# Patient Record
Sex: Male | Born: 1995 | Race: Black or African American | Hispanic: No | Marital: Single | Smoking: Current every day smoker
Health system: Southern US, Community
[De-identification: ages and names within clinical notes are randomized; demographics above are authoritative.]

## PROBLEM LIST (undated history)

## (undated) HISTORY — PX: SKIN GRAFT: SHX250

## (undated) HISTORY — PX: TOE AMPUTATION: SHX809

---
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
## 2014-04-30 ENCOUNTER — Emergency Department (INDEPENDENT_AMBULATORY_CARE_PROVIDER_SITE_OTHER): Payer: BC Managed Care – PPO

## 2014-04-30 ENCOUNTER — Encounter (HOSPITAL_COMMUNITY): Payer: Self-pay | Admitting: Emergency Medicine

## 2014-04-30 ENCOUNTER — Emergency Department (INDEPENDENT_AMBULATORY_CARE_PROVIDER_SITE_OTHER)
Admission: EM | Admit: 2014-04-30 | Discharge: 2014-04-30 | Disposition: A | Discharge status: Home / Self Care | Payer: BC Managed Care – PPO | Source: Home / Self Care | Attending: Family Medicine | Admitting: Family Medicine

## 2014-04-30 DIAGNOSIS — M21962 Unspecified acquired deformity of left lower leg: Secondary | ICD-10-CM

## 2014-04-30 DIAGNOSIS — M79672 Pain in left foot: Secondary | ICD-10-CM

## 2014-04-30 MED ORDER — DICLOFENAC SODIUM 75 MG PO TBEC: 75.0000 mg | DELAYED_RELEASE_TABLET | Freq: Two times a day (BID) | ORAL | Status: AC | PRN
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
## 2014-04-30 NOTE — Discharge Instructions (Signed)
Thank you for coming in today. Follow up with Podiatry.  Take voltaren for pain as needed.  Go to the emergency room if you have acutely worsening pain

## 2014-04-30 NOTE — ED Notes (Signed)
Patient c/o left foot pain x 1 week. Patient reports he has had multiple surgeries and has had toes amputated due to injury. Patient reports foot hurts when he stands or puts pressure on it. Patient ambulated to room well. NAD.

## 2015-03-13 IMAGING — CR DG FOOT COMPLETE 3+V*L*
3 series · 3 of 3 positions shown · non-contrast
Comparison: None.

CLINICAL DATA: Left foot pain.  No recent trauma.

EXAM:
LEFT FOOT - COMPLETE 3+ VIEW

[foot ap]
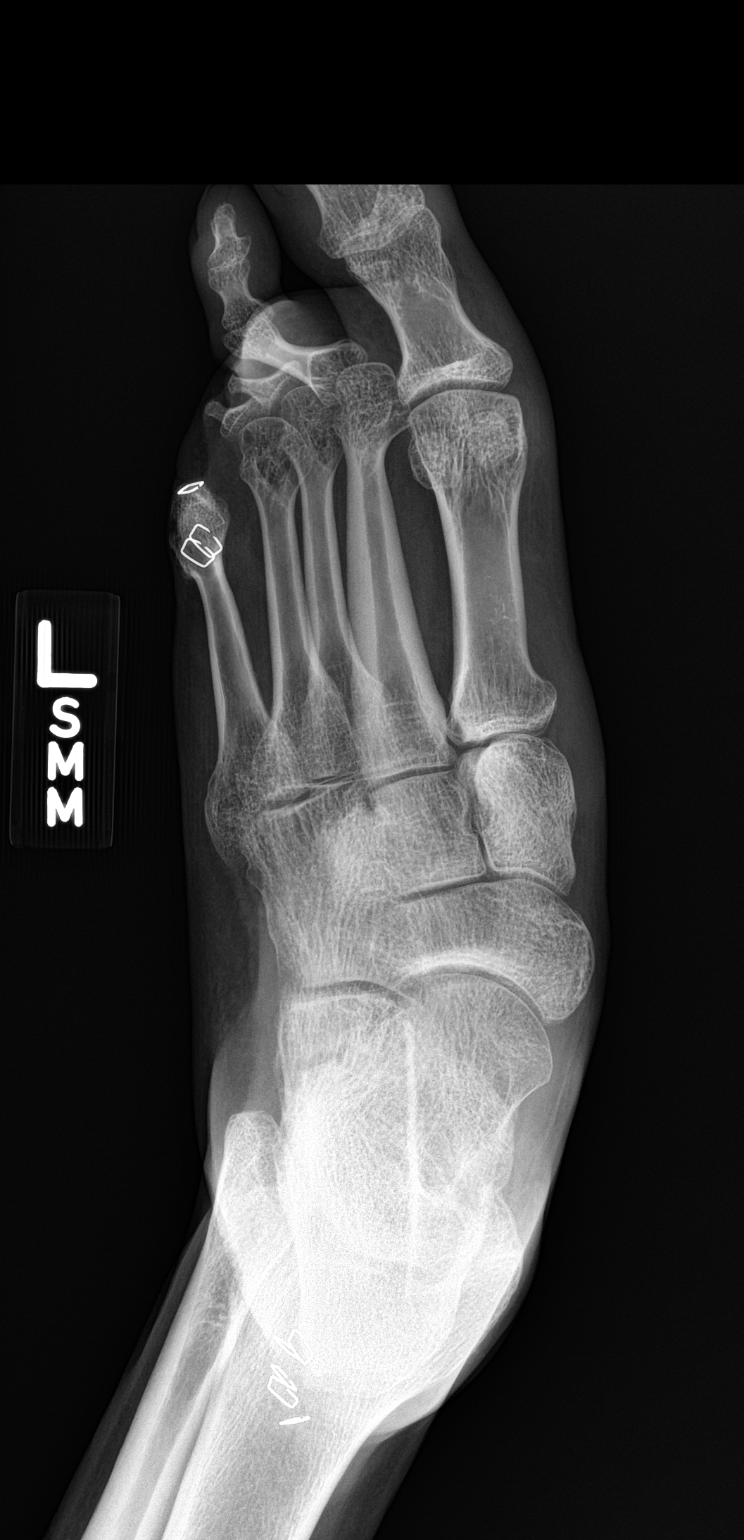

[foot obl]
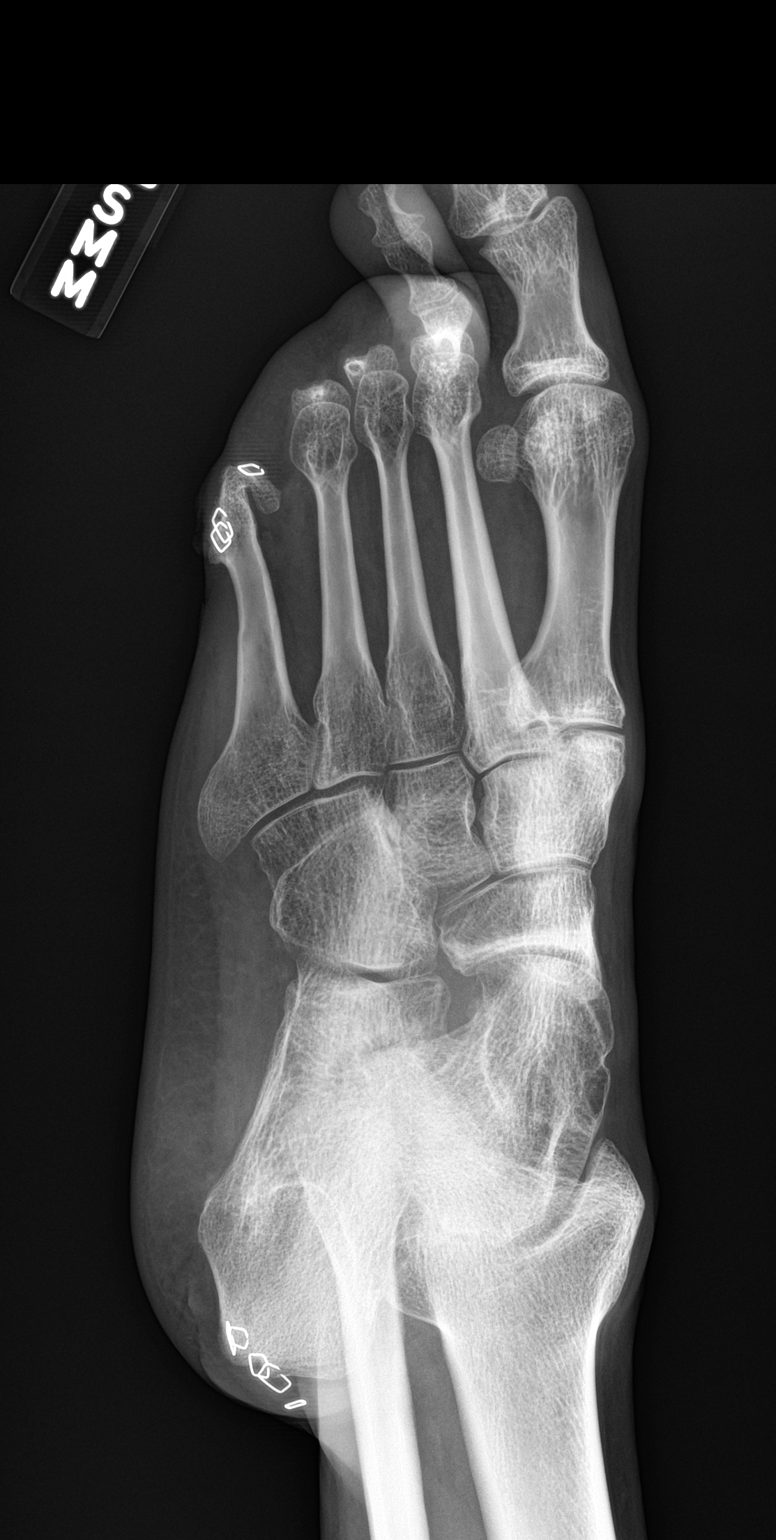

[foot lat]
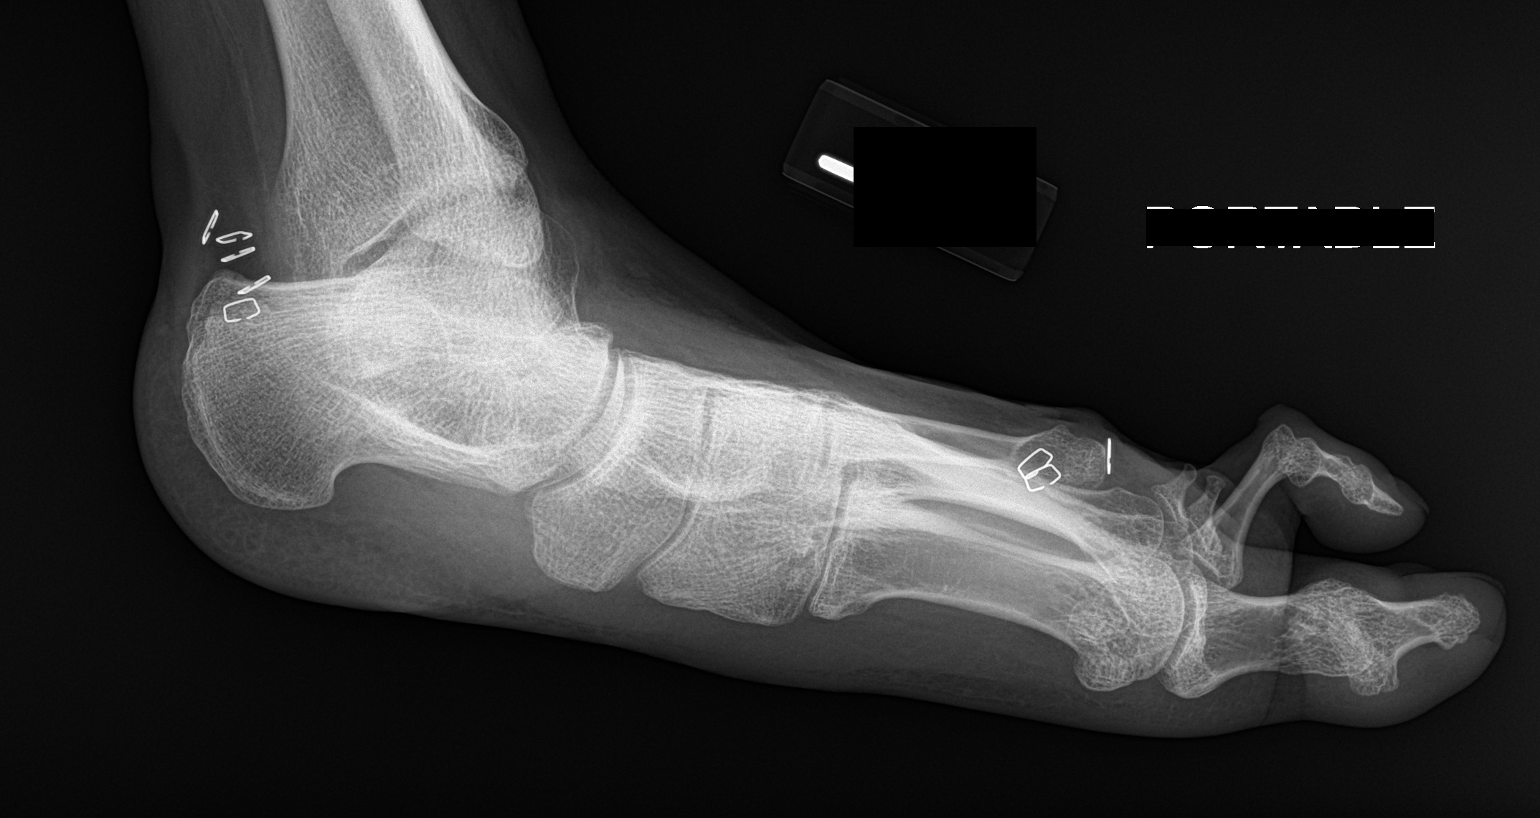

[3 of 3 positions shown; findings below may reference images not displayed]

FINDINGS: The little toe and the head of the fifth metatarsal appear to have
been resected with some chronic deformity of the distal fifth
metatarsal. The third and fourth toes appear to have been amputated
except for the remnants of the bases of the proximal phalanges.

There are minimal degenerative changes at the first tarsal
metatarsal joint. The patient does have a flatfoot deformity.
Surgical staples is seen adjacent to the head of the fifth
metatarsal and posterior to the calcaneus.
IMPRESSION: No acute abnormalities. Chronic deformities and probable
postsurgical changes as described.

## 2021-01-27 ENCOUNTER — Other Ambulatory Visit: Payer: Self-pay

## 2021-01-27 ENCOUNTER — Emergency Department (HOSPITAL_BASED_OUTPATIENT_CLINIC_OR_DEPARTMENT_OTHER)
Admission: EM | Admit: 2021-01-27 | Discharge: 2021-01-28 | Disposition: A | Discharge status: Home / Self Care | Payer: Self-pay | Attending: Emergency Medicine | Admitting: Emergency Medicine

## 2021-01-27 DIAGNOSIS — Z23 Encounter for immunization: Secondary | ICD-10-CM | POA: Insufficient documentation

## 2021-01-27 DIAGNOSIS — F17200 Nicotine dependence, unspecified, uncomplicated: Secondary | ICD-10-CM | POA: Insufficient documentation

## 2021-01-27 DIAGNOSIS — W260XXA Contact with knife, initial encounter: Secondary | ICD-10-CM | POA: Insufficient documentation

## 2021-01-27 DIAGNOSIS — S61512A Laceration without foreign body of left wrist, initial encounter: Secondary | ICD-10-CM | POA: Insufficient documentation

## 2021-01-27 DIAGNOSIS — S61412A Laceration without foreign body of left hand, initial encounter: Secondary | ICD-10-CM

## 2021-01-27 MED ORDER — TETANUS-DIPHTH-ACELL PERTUSSIS 5-2.5-18.5 LF-MCG/0.5 IM SUSY
0.5000 mL | PREFILLED_SYRINGE | Freq: Once | INTRAMUSCULAR | Status: AC
  Administered 2021-01-28: 0.5 mL via INTRAMUSCULAR
  Filled 2021-01-27: qty 0.5

## 2021-01-27 MED ORDER — LIDOCAINE HCL 2 % IJ SOLN
10.0000 mL | Freq: Once | INTRAMUSCULAR | Status: AC
  Administered 2021-01-28: 200 mg via INTRADERMAL
  Filled 2021-01-27: qty 20

## 2021-01-27 MED ORDER — BACITRACIN ZINC 500 UNIT/GM EX OINT
TOPICAL_OINTMENT | Freq: Once | CUTANEOUS | Status: AC
  Filled 2021-01-27: qty 28.35

## 2021-01-27 NOTE — ED Provider Notes (Signed)
[facility] EMERGENCY DEPARTMENT Provider Note   CSN: [id number] Arrival date & time: 01/27/21  1923     History Chief Complaint  Patient presents with   Extremity Laceration    General Wearing is a 25 y.o. male who presents for evaluation of laceration noted to his left wrist that occurred earlier this evening.  Patient reports that he was trying to make a new hole in his belt and was using a knife and states it slipped, causing a laceration to his anterior left wrist.  He is on blood thinners.  Denies any numbness/weakness.  He does not know when his last tetanus shot was.  The history is provided by the patient.      No past medical history on file.  There are no problems to display for this patient.   Past Surgical History:  Procedure Laterality Date   SKIN GRAFT     TOE AMPUTATION         No family history on file.  Social History   Tobacco Use   Smoking status: Every Day  Substance Use Topics   Alcohol use: No   Drug use: Yes    Types: Marijuana    Home Medications Prior to Admission medications   Medication Sig Start Date End Date Taking? Authorizing Provider  diclofenac (VOLTAREN) 75 MG EC tablet Take 1 tablet (75 mg total) by mouth 2 (two) times daily as needed. 04/30/14   Rodolph Bong, MD    Allergies    Patient has no known allergies.  Review of Systems   Review of Systems  Skin:  Positive for wound.  Neurological:  Negative for weakness and numbness.  All other systems reviewed and are negative.  Physical Exam Updated Vital Signs BP (!) 142/74 (BP Location: Right Arm)   Pulse 67   Temp 99.4 F (37.4 C) (Oral)   Resp 14   Ht 5\' 9"  (1.753 m)   Wt 81.6 kg   SpO2 100%   BMI 26.58 kg/m   Physical Exam Vitals and nursing note reviewed.  Constitutional:      Appearance: He is well-developed.  HENT:     Head: Normocephalic and atraumatic.  Eyes:     General: No scleral icterus.       Right eye: No discharge.        Left  eye: No discharge.     Conjunctiva/sclera: Conjunctivae normal.  Cardiovascular:     Pulses:          Radial pulses are 2+ on the right side and 2+ on the left side.  Pulmonary:     Effort: Pulmonary effort is normal.  Musculoskeletal:     Comments: Full flexion/tension of all 5 digits of the left hand at both the DIP and PIP without any difficulty.  He can easily make a fist.  Flexion/tension of the left wrist intact with any difficulty.  Skin:    General: Skin is warm and dry.     Comments: Small 1 cm laceration noted to the anterior aspect of the wrist. Good distal cap refill.  LUE is not dusky in appearance or cool to touch.  Neurological:     Mental Status: He is alert.  Psychiatric:        Speech: Speech normal.        Behavior: Behavior normal.    ED Results / Procedures / Treatments   Labs (all labs ordered are listed, but only abnormal results are displayed) Labs Reviewed -  No data to display  EKG None  Radiology No results found.  Procedures .Marland KitchenLaceration Repair  Date/Time: 01/27/2021 11:57 PM Performed by: Maxwell Caul, PA-C Authorized by: Maxwell Caul, PA-C   Consent:    Consent obtained:  Verbal   Consent given by:  Patient   Risks discussed:  Infection, need for additional repair, pain, poor cosmetic result and poor wound healing   Alternatives discussed:  No treatment and delayed treatment Universal protocol:    Procedure explained and questions answered to patient or proxy's satisfaction: yes     Relevant documents present and verified: yes     Test results available: yes     Imaging studies available: yes     Required blood products, implants, devices, and special equipment available: yes     Site/side marked: yes     Immediately prior to procedure, a time out was called: yes     Patient identity confirmed:  Verbally with patient Anesthesia:    Anesthesia method:  Local infiltration   Local anesthetic:  Lidocaine 2% w/o epi Laceration  details:    Location:  Hand   Hand location:  L wrist   Length (cm):  1 Pre-procedure details:    Preparation:  Patient was prepped and draped in usual sterile fashion Exploration:    Wound exploration: wound explored through full range of motion     Wound extent: no foreign bodies/material noted   Treatment:    Area cleansed with:  Povidone-iodine   Amount of cleaning:  Extensive   Irrigation solution:  Sterile saline   Irrigation method:  Syringe   Visualized foreign bodies/material removed: no   Skin repair:    Repair method:  Sutures   Suture size:  4-0   Suture material:  Nylon   Suture technique:  Simple interrupted   Number of sutures:  3 Approximation:    Approximation:  Close Repair type:    Repair type:  Simple Post-procedure details:    Dressing:  Antibiotic ointment and non-adherent dressing   Procedure completion:  Tolerated Comments:     Once the wound was anesthetized, was thoroughly extensively irrigated with sterile saline.  Full exploration of the wound through full range of motion showed no evidence of tendon disruption, vascular injury.  No foreign body noted.  Using the blunt hemostats, the area was evaluated and the laceration did not extend below the subcutaneous fat.  Laceration repaired as documented above.   Medications Ordered in ED Medications  Tdap (BOOSTRIX) injection 0.5 mL (0.5 mLs Intramuscular Given 01/28/21 0007)  lidocaine (XYLOCAINE) 2 % (with pres) injection 200 mg (200 mg Intradermal Given 01/28/21 0008)  bacitracin ointment ( Topical Given 01/28/21 0007)    ED Course  I have reviewed the triage vital signs and the nursing notes.  Pertinent labs & imaging results that were available during my care of the patient were reviewed by me and considered in my medical decision making (see chart for details).    MDM Rules/Calculators/A&P                          25 year old male who presents for evaluation of laceration to his left wrist.   Reports that earlier today, he was trying to make a new hole in his belt with a knife and states he slipped.  He is not on blood thinners.  On initial arrival, he is afebrile, nontoxic-appearing.  Vital signs are stable.  On exam, he  has a 1 cm laceration noted to the anterior aspect of his wrist.  He has good radial and ulnar pulses.  Fingers are not dusky in appearance.  He has good cap refill.  He has full flexion/tension of all 5 digits any difficulty.  Do not suspect vascular or tendon injury.  We will plan for wound care, tetanus update.  Laceration repaired as documented above.  Patient tolerated procedure well. At this time, patient exhibits no emergent life-threatening condition that require further evaluation in ED. Patient had ample opportunity for questions and discussion. All patient's questions were answered with full understanding. Strict return precautions discussed. Patient expresses understanding and agreement to plan.   Portions of this note were generated with Scientist, clinical (histocompatibility and immunogenetics). Dictation errors may occur despite best attempts at proofreading.  Final Clinical Impression(s) / ED Diagnoses Final diagnoses:  Laceration of left hand without foreign body, initial encounter    Rx / DC Orders ED Discharge Orders     None        Rosana Hoes 01/28/21 0008    Molpus, Jonny Ruiz, MD 01/28/21 [phone]

## 2021-01-27 NOTE — Discharge Instructions (Addendum)
Keep the wound clean and dry for the first 24 hours. After that you may gently clean the wound with soap and water. Make sure to pat dry the wound before covering it with any dressing. You can use topical antibiotic ointment and bandage. Ice and elevate for pain relief.   You can take Tylenol or Ibuprofen as directed for pain. You can alternate Tylenol and Ibuprofen every 4 hours for additional pain relief.   Return to the Emergency Department, your primary care doctor, or the [facility] in 7 days for suture removal.   Monitor closely for any signs of infection. Return to the Emergency Department for any worsening redness/swelling of the area that begins to spread, drainage from the site, worsening pain, fever or any other worsening or concerning symptoms.    

## 2021-01-27 NOTE — ED Triage Notes (Signed)
Pt presents to ED POV. Pt c/o lac to L wrist. Bleeding controlled. Pt not UTD.
